# Patient Record
Sex: Male | Born: 1960 | Race: White | Hispanic: No | Marital: Married
Health system: Southern US, Community
[De-identification: ages and names within clinical notes are randomized; demographics above are authoritative.]

## PROBLEM LIST (undated history)

## (undated) DIAGNOSIS — K219 Gastro-esophageal reflux disease without esophagitis: Secondary | ICD-10-CM

## (undated) HISTORY — DX: Gastro-esophageal reflux disease without esophagitis: K21.9

---

## 2010-10-12 ENCOUNTER — Ambulatory Visit: Payer: Self-pay | Admitting: Family Medicine

## 2011-08-02 IMAGING — CR DG SHOULDER 3+V*R*
1 series · 3 of 3 positions shown · non-contrast
Comparison: none

REASON FOR EXAM: shoulder pain x0days
COMMENTS:

[Series 1: view not recorded · 0.17mm/px · 3 of 3 slices shown]
[im 1/3]
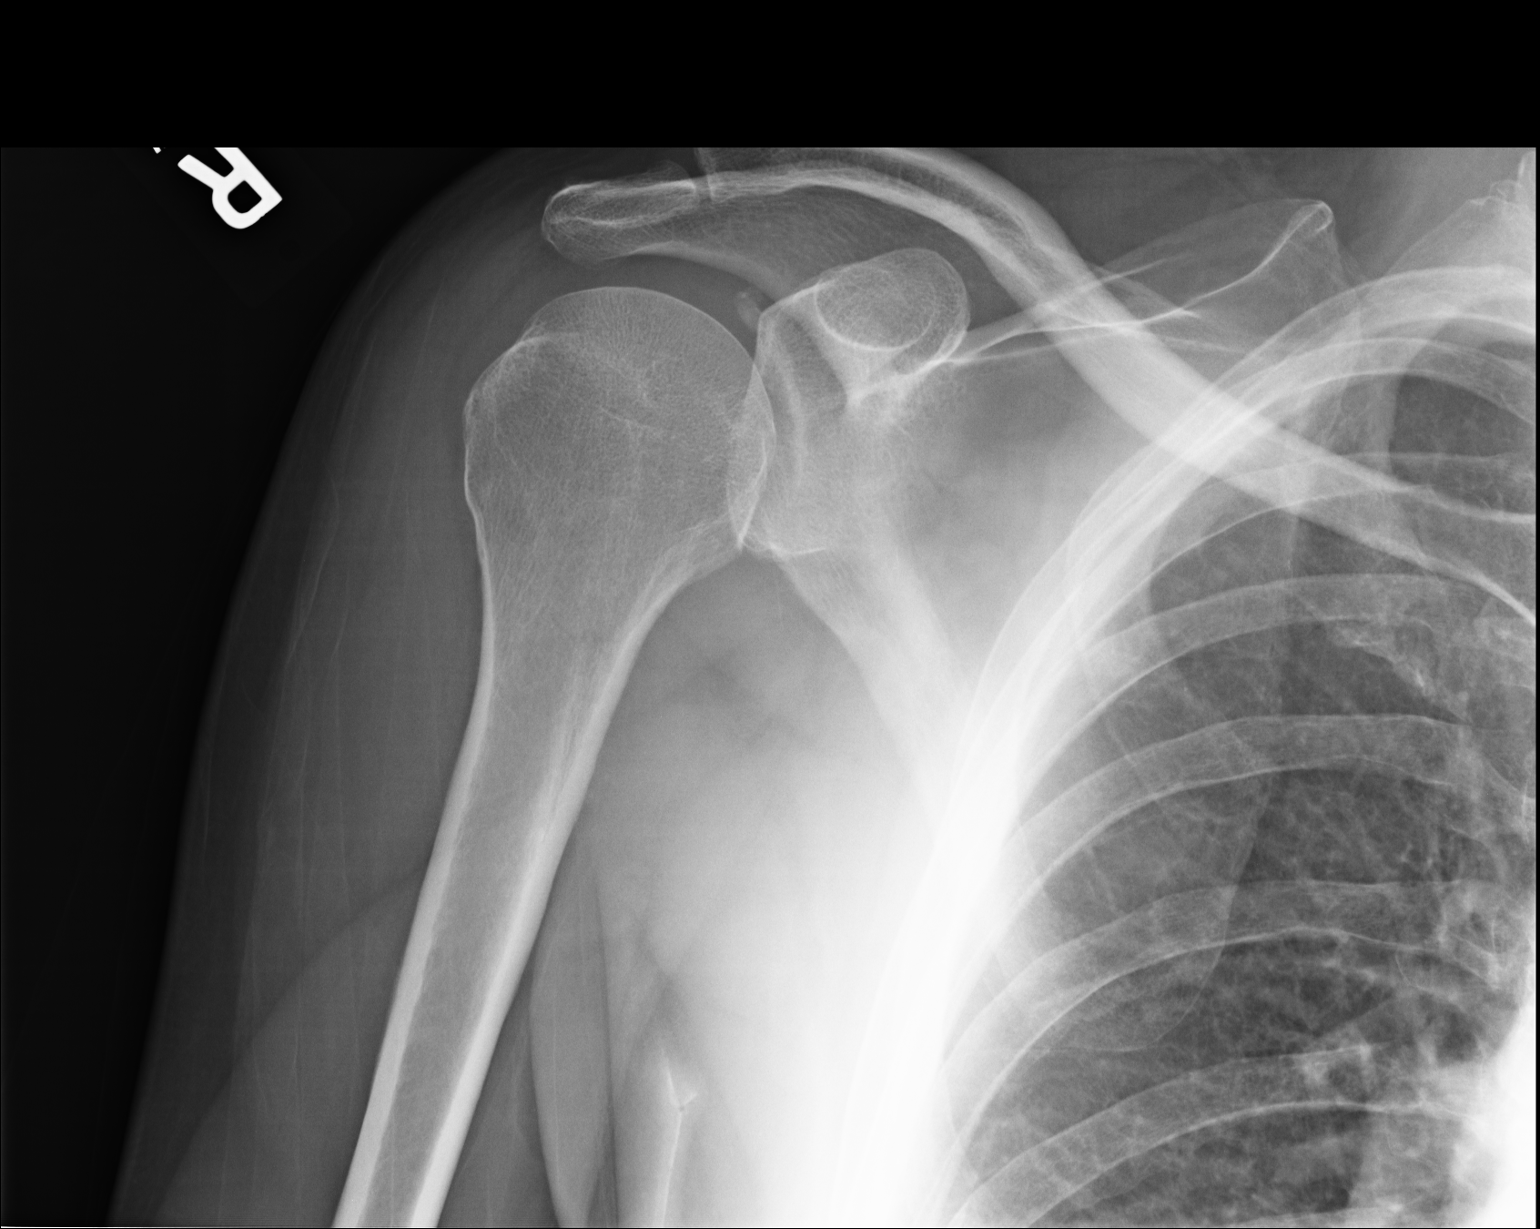
[im 2/3]
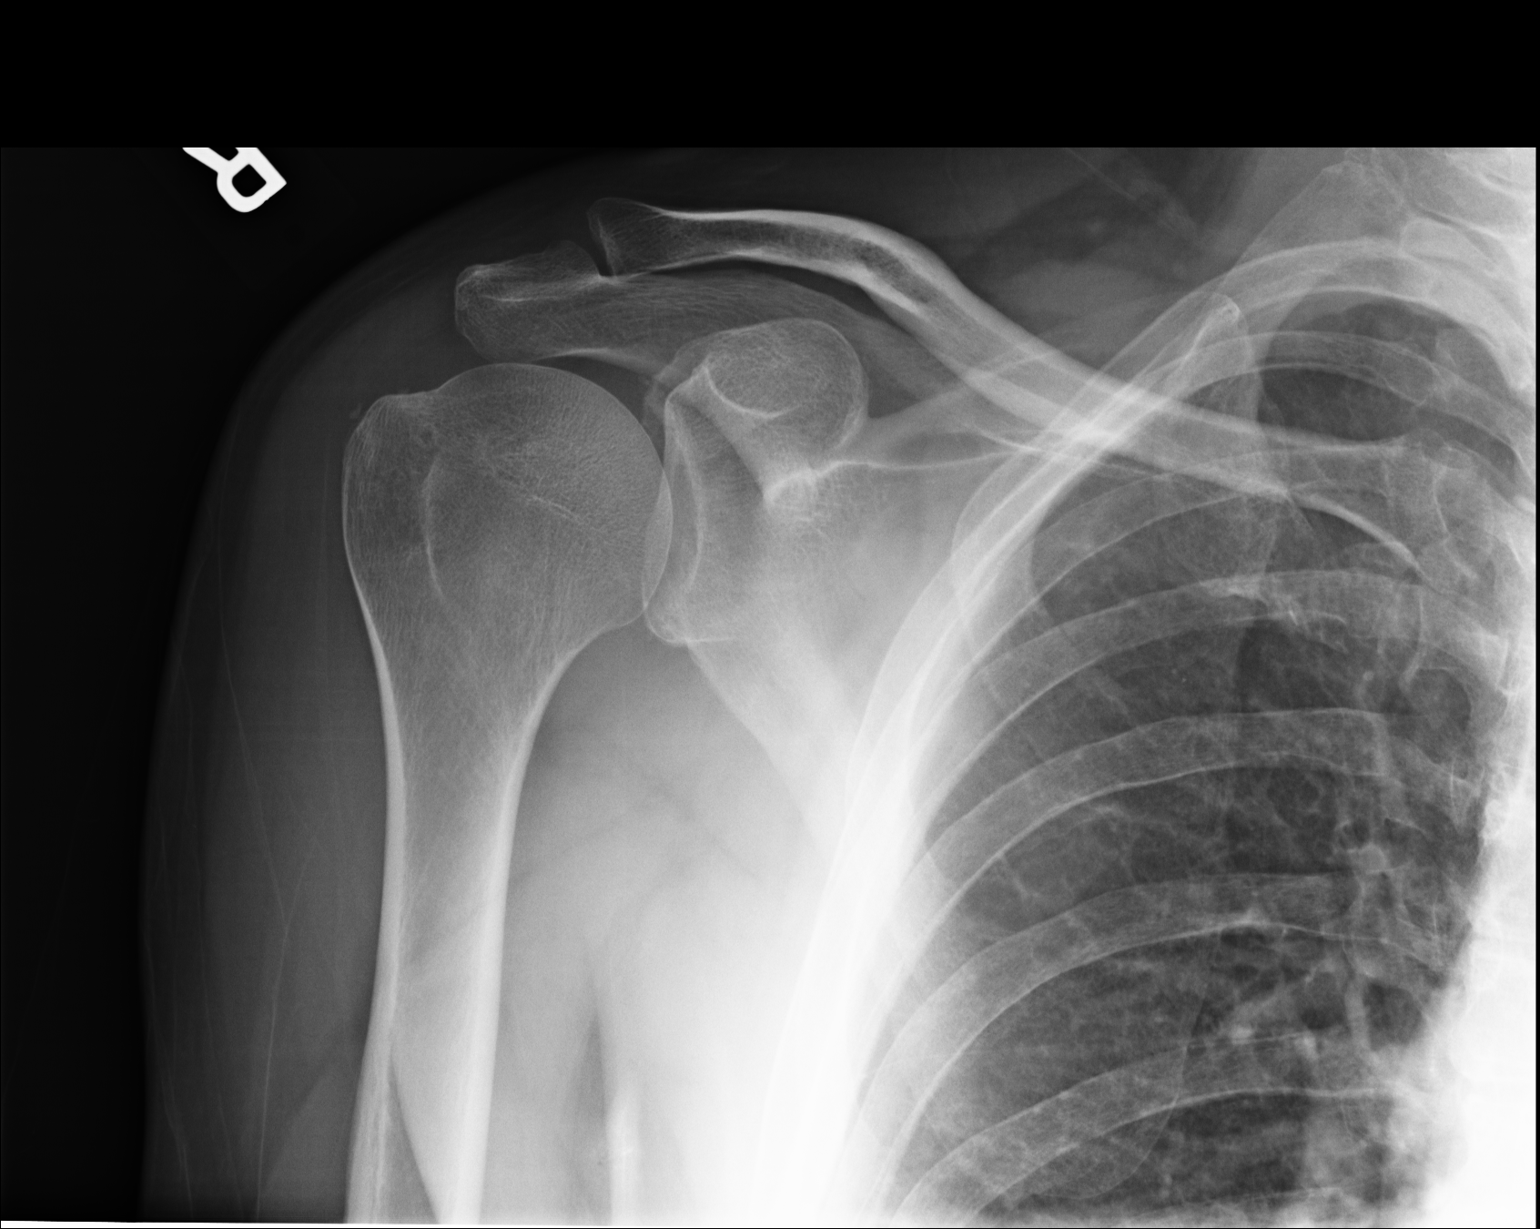
[im 3/3]
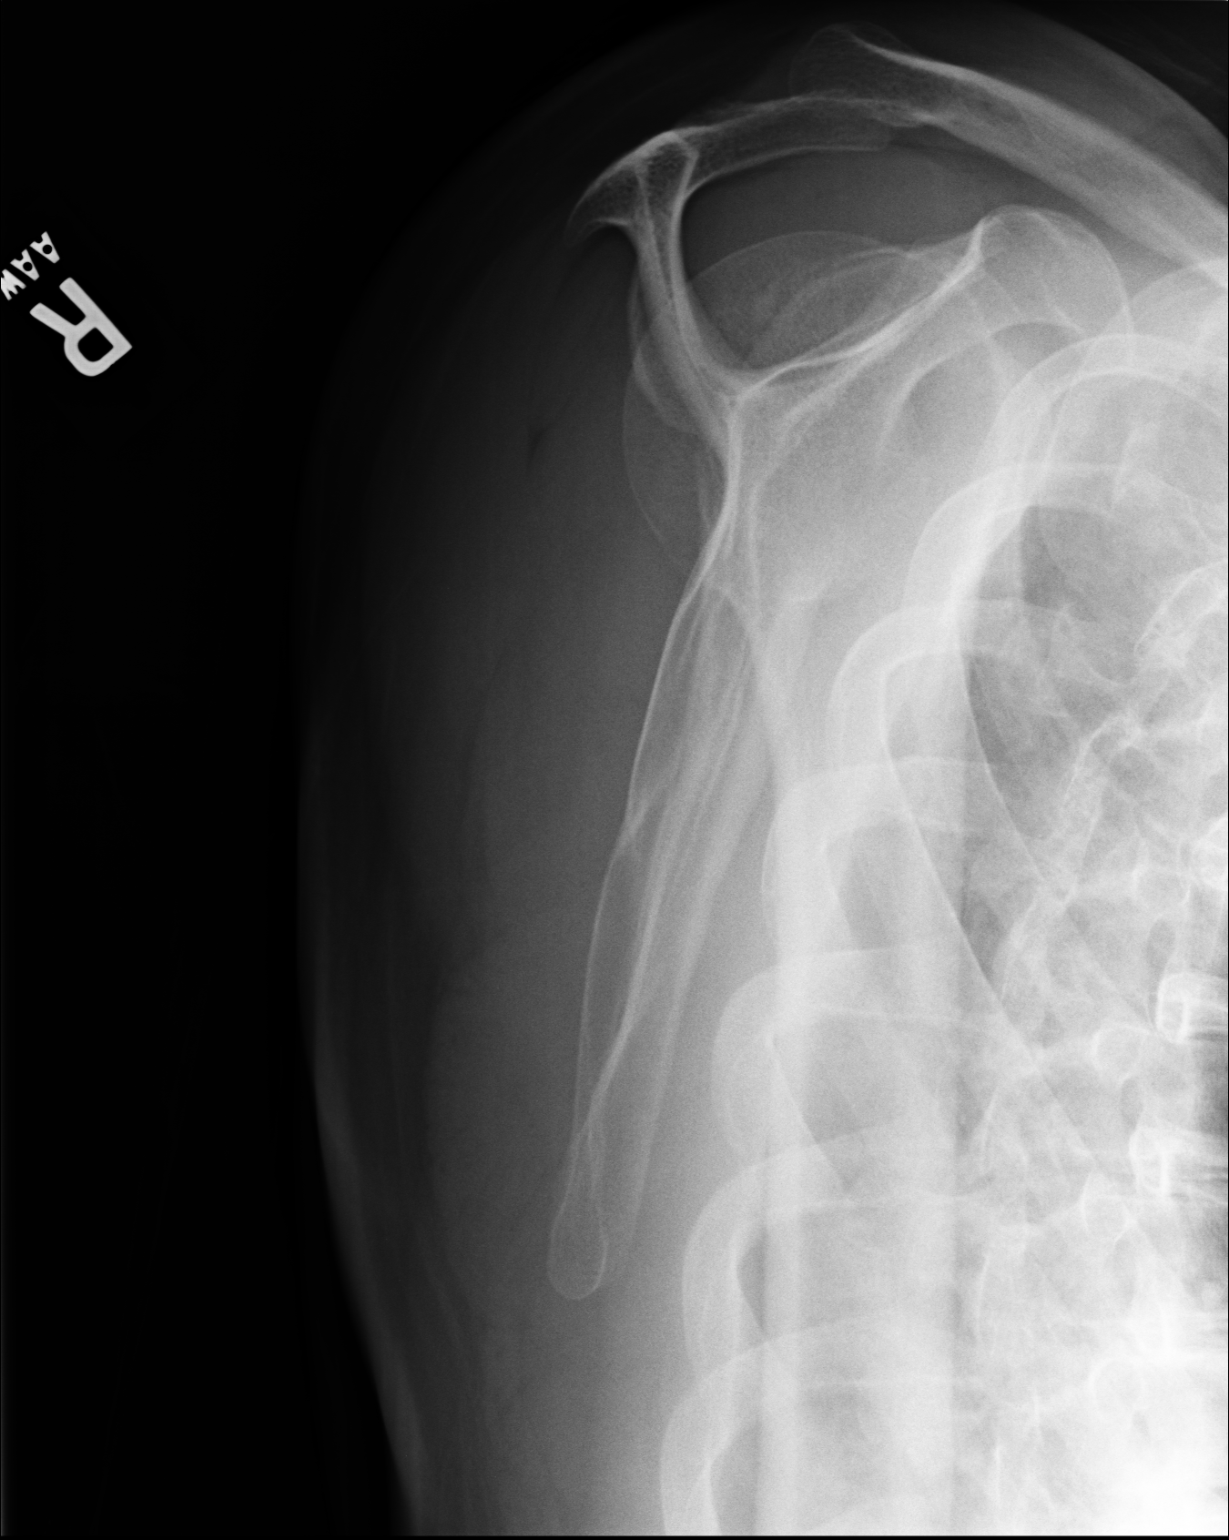

[3 of 3 positions shown; findings below may reference images not displayed]

PROCEDURE:     DXR - DXR SHOULDER RIGHT COMPLETE  - October 12, 2010 [DATE]

RESULT:     There is an osseous density projected along the superior margin
of the glenoid fossa. This may represent a new or more likely old fracture
of the glenoid. Alternatively, the finding could represent a spur off the
acromion. This could be further evaluated with MR, if such is clinically
indicated. The osseous structures about the right shoulder otherwise are
normal in appearance. No lytic or blastic lesions are seen. There is no
dislocation.
IMPRESSION: Normal study except for an osseous density at the superior
aspect of the glenoid fossa. The etiology for this is unclear. The finding
likely represents an old or new fracture of the glenoid or possibly a spur
off the acromion. As mentioned above, this could be further evaluated with
MR, if such is clinically indicated.

## 2022-06-20 ENCOUNTER — Encounter (INDEPENDENT_AMBULATORY_CARE_PROVIDER_SITE_OTHER): Payer: Self-pay | Admitting: Ophthalmology

## 2022-06-20 ENCOUNTER — Ambulatory Visit (INDEPENDENT_AMBULATORY_CARE_PROVIDER_SITE_OTHER): Payer: BC Managed Care – PPO | Admitting: Ophthalmology

## 2022-06-20 DIAGNOSIS — H25813 Combined forms of age-related cataract, bilateral: Secondary | ICD-10-CM

## 2022-06-20 DIAGNOSIS — H43811 Vitreous degeneration, right eye: Secondary | ICD-10-CM

## 2022-06-20 DIAGNOSIS — H4710 Unspecified papilledema: Secondary | ICD-10-CM | POA: Diagnosis not present

## 2022-06-20 NOTE — Progress Notes (Signed)
[facility] - Clinic Note  06/20/2022     CHIEF COMPLAINT Patient presents for Retina Evaluation   HISTORY OF PRESENT ILLNESS: Dominic Newman is a 61 y.o. male who presents to the clinic today for:   HPI     Retina Evaluation   In right eye.  This started 2 weeks ago.  Duration of 2 months.  I, the attending physician,  performed the HPI with the patient and updated documentation appropriately.        Comments   Patient here for Retina Evaluation. Referred by Dr Sharia Reeve. Patient states vision is okay. OD is blurry at the bottom. Dr Sharia Reeve saw swelling and Inflamation. Two months ago before this had 4 - 6 black spots clustered together. They went away. Then this began. Foggy shadowy. Not black. No eye pain.       Last edited by Rennis Chris, MD on 06/20/2022 10:44 AM.    Pt is here on the referral of Dr. Leron Croak for concern of optic disc swelling, pt states 2 months ago, he had several floaters that were black and clustered together in his right eye, he states those have resolved, but he now has a shadowy spot in his inferior vision OD, he denies any hx of heart problems, he takes medication for acid reflux, wife reports pt snores when he sleeps, but she has not noticed him stopping breathing  Referring physician: Yvette Rack, OD [address] [place],  [place] [address]  HISTORICAL INFORMATION:   Selected notes from the MEDICAL RECORD NUMBER Referred by Dr. Leron Croak for disc edema OD LEE:  Ocular Hx- PMH-    CURRENT MEDICATIONS: No current outpatient medications on file. (Ophthalmic Drugs)   No current facility-administered medications for this visit. (Ophthalmic Drugs)   Current Outpatient Medications (Other)  Medication Sig   omeprazole (PRILOSEC) 20 MG capsule Take 20 mg by mouth daily.   No current facility-administered medications for this visit. (Other)   REVIEW OF SYSTEMS: ROS   Positive for: Eyes Negative for: Constitutional,  Gastrointestinal, Neurological, Skin, Genitourinary, Musculoskeletal, HENT, Endocrine, Cardiovascular, Respiratory, Psychiatric, Allergic/Imm, Heme/Lymph Last edited by Thompson Grayer, COT on 06/20/2022  8:55 AM.     ALLERGIES No Known Allergies  PAST MEDICAL HISTORY Past Medical History:  Diagnosis Date   Acid reflux    History reviewed. No pertinent surgical history.  FAMILY HISTORY Family History  Problem Relation Age of Onset   Diabetes Paternal Grandfather     SOCIAL HISTORY Social History   Substance Use Topics   Drug use: Never       OPHTHALMIC EXAM:  Base Eye Exam     Visual Acuity (Snellen - Linear)       Right Left   Dist cc 20/20 -1 20/20    Correction: Glasses         Tonometry (Tonopen, 8:23 AM)       Right Left   Pressure 15 14         Pupils       Dark Light Shape React APD   Right 3 2 Round Brisk None   Left 3 2 Round Brisk None         Visual Fields (Counting fingers)       Left Right    Full Full         Extraocular Movement       Right Left    Full, Ortho Full, Ortho  Neuro/Psych     Oriented x3: Yes   Mood/Affect: Normal         Dilation     Both eyes: 1.0% Mydriacyl, 2.5% Phenylephrine @ 8:23 AM           Slit Lamp and Fundus Exam     Slit Lamp Exam       Right Left   Lids/Lashes Dermatochalasis - upper lid Dermatochalasis - upper lid   Conjunctiva/Sclera White and quiet White and quiet   Cornea trace PEE trace PEE   Anterior Chamber Deep and quiet Deep and quiet   Iris Round and dilated Round and dilated   Lens 2+ Nuclear sclerosis, 2+ Cortical cataract 2+ Nuclear sclerosis, 2+ Cortical cataract   Anterior Vitreous Mild syneresis, Posterior vitreous detachment Mild syneresis         Fundus Exam       Right Left   Disc Superior edema with trace flame hemes Pink and Sharp, focal PPP superiorly   C/D Ratio 0.1 0.4   Macula Flat, rare, fine drusen, No heme or edema Flat, Good  foveal reflex, No heme or edema   Vessels attenuated, Tortuous mild attenuation, mild tortuosity   Periphery Attached Attached           Refraction     Wearing Rx       Sphere Cylinder Axis Add   Right -1.25 +1.25 170 +2.00   Left -1.50 +0.75 007 +2.00    Age: 61yrs           IMAGING AND PROCEDURES  Imaging and Procedures for 06/20/2022  OCT, Retina - OU - Both Eyes       Right Eye Quality was good. Central Foveal Thickness: 341. Progression has no prior data. Findings include normal foveal contour, no IRF, no SRF (Elevation / edema superior disc).   Left Eye Quality was good. Central Foveal Thickness: 352. Progression has no prior data. Findings include normal foveal contour, no IRF, no SRF, vitreomacular adhesion .   Notes *Images captured and stored on drive  Diagnosis / Impression:  NFP, no IRF / SRF OU OD: elevation / edema of superior disc  Clinical management:  See below  Abbreviations: NFP - Normal foveal profile. CME - cystoid macular edema. PED - pigment epithelial detachment. IRF - intraretinal fluid. SRF - subretinal fluid. EZ - ellipsoid zone. ERM - epiretinal membrane. ORA - outer retinal atrophy. ORT - outer retinal tubulation. SRHM - subretinal hyper-reflective material. 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 PVD (posterior vitreous detachment), right  H43.811 OCT, Retina - OU - Both Eyes    3. Combined forms of age-related cataract of both eyes  H25.813      Optic disc edema OD - segmental disc edema with hemorrhage -- superior disc OD - BCVA 20/20 OD, but pt reports some focal blurring in inf visual field - pt relatively healthy without significant cardiovascular history, but pt's wife does report some snoring -- ?sleep apnea - no significant medications -- pt reports only on omeprazole - suspect NAION, but recommend further Neuro-Ophth  evaluation - will refer to [facility]  2. PVD / vitreous syneresis OD  - subacute floaters improved -- onset ~2 mos ago (May/June 2023)  - Discussed findings and prognosis  - No RT or RD on 360 peripheral exam  -  Reviewed s/s of RT/RD  - Strict return precautions for any such RT/RD signs/symptoms  3. Mixed Cataract OU - The symptoms of cataract, surgical options, and treatments and risks were discussed with patient. - discussed diagnosis and progression - not yet visually significant - monitor for now  Ophthalmic Meds Ordered this visit:  No orders of the defined types were placed in this encounter.    Return if symptoms worsen or fail to improve.  There are no Patient Instructions on file for this visit.   Explained the diagnoses, plan, and follow up with the patient and they expressed understanding.  Patient expressed understanding of the importance of proper follow up care.   This document serves as a record of services personally performed by Karie Chimera, MD, PhD. It was created on their behalf by Glee Arvin. Manson Passey, OA an ophthalmic technician. The creation of this record is the provider's dictation and/or activities during the visit.    Electronically signed by: Glee Arvin. Manson Passey, [place] 07.31.2023 10:46 AM   Karie Chimera, M.D., Ph.D. Diseases & Surgery of the Retina and Vitreous [facility]  I have reviewed the above documentation for accuracy and completeness, and I agree with the above. Karie Chimera, M.D., Ph.D. 06/20/22 10:53 AM  Abbreviations: M myopia (nearsighted); A astigmatism; H hyperopia (farsighted); P presbyopia; Mrx spectacle prescription;  CTL contact lenses; OD right eye; OS left eye; OU both eyes  XT exotropia; ET esotropia; PEK punctate epithelial keratitis; PEE punctate epithelial erosions; DES dry eye syndrome; MGD meibomian gland dysfunction; ATs artificial tears; PFAT's preservative free artificial tears; NSC nuclear  sclerotic cataract; PSC posterior subcapsular cataract; ERM epi-retinal membrane; PVD posterior vitreous detachment; RD retinal detachment; DM diabetes mellitus; DR diabetic retinopathy; NPDR non-proliferative diabetic retinopathy; PDR proliferative diabetic retinopathy; CSME clinically significant macular edema; DME diabetic macular edema; dbh dot blot hemorrhages; CWS cotton wool spot; POAG primary open angle glaucoma; C/D cup-to-disc ratio; HVF humphrey visual field; GVF goldmann visual field; OCT optical coherence tomography; IOP intraocular pressure; BRVO Branch retinal vein occlusion; CRVO central retinal vein occlusion; CRAO central retinal artery occlusion; BRAO branch retinal artery occlusion; RT retinal tear; SB scleral buckle; PPV pars plana vitrectomy; VH Vitreous hemorrhage; PRP panretinal laser photocoagulation; IVK intravitreal kenalog; VMT vitreomacular traction; MH Macular hole;  NVD neovascularization of the disc; NVE neovascularization elsewhere; AREDS age related eye disease study; ARMD age related macular degeneration; POAG primary open angle glaucoma; EBMD epithelial/anterior basement membrane dystrophy; ACIOL anterior chamber intraocular lens; IOL intraocular lens; PCIOL posterior chamber intraocular lens; Phaco/IOL phacoemulsification with intraocular lens placement; PRK photorefractive keratectomy; LASIK laser assisted in situ keratomileusis; HTN hypertension; DM diabetes mellitus; COPD chronic obstructive pulmonary disease

## 2022-06-21 NOTE — Addendum Note (Signed)
Addended by: Karie Chimera on: 06/21/2022 10:42 AM   Modules accepted: Orders
